# Patient Record
Sex: Male | Born: 1975 | Hispanic: No | Marital: Single | Smoking: Never smoker
Health system: Southern US, Community
[De-identification: ages and names within clinical notes are randomized; demographics above are authoritative.]

---

## 2000-09-19 ENCOUNTER — Emergency Department (HOSPITAL_COMMUNITY): Admission: EM | Admit: 2000-09-19 | Discharge: 2000-09-19 | Payer: Self-pay

## 2016-02-18 ENCOUNTER — Encounter: Payer: Self-pay | Admitting: Physician Assistant

## 2016-02-18 ENCOUNTER — Ambulatory Visit (INDEPENDENT_AMBULATORY_CARE_PROVIDER_SITE_OTHER): Payer: Self-pay | Admitting: Physician Assistant

## 2016-02-18 VITALS — BP 120/78 | HR 64 | Temp 97.9°F | Resp 16 | Ht <= 58 in | Wt 198.0 lb

## 2016-02-18 DIAGNOSIS — Z0000 Encounter for general adult medical examination without abnormal findings: Secondary | ICD-10-CM

## 2016-02-18 DIAGNOSIS — Z23 Encounter for immunization: Secondary | ICD-10-CM

## 2016-02-18 NOTE — Progress Notes (Signed)
Patient ID: Jon Ware MRN: [id number], DOB: 11-22-1975 40 y.o. Date of Encounter: 02/18/2016, 4:02 PM    Chief Complaint: Physical (CPE)  HPI: 40 y.o. y/o male here for CPE.   He reports that he "has never had a physical", "never goes to the doctor".  Decided that he should come get a checkup.  Has no known medical history. Has had no surgeries.  His parents still live in [place] and are living. Father has "sugar ". Father has no other known medical history. Mother has no known medical problems. No other significant/known family medical history.  He came [place] years ago at age 40. Says that he has lived in [place] area most all of that time. For short amount of time he lived in [place]. He works Copypainting interiors. Currently doing work at [employer].   Review of Systems: Consitutional: No fever, chills, fatigue, night sweats, lymphadenopathy, or weight changes. Eyes: No visual changes, eye redness, or discharge. ENT/Mouth: Ears: No otalgia, tinnitus, hearing loss, discharge. Nose: No congestion, rhinorrhea, sinus pain, or epistaxis. Throat: No sore throat, post nasal drip, or teeth pain. Cardiovascular: No CP, palpitations, diaphoresis, DOE, edema, orthopnea, PND. Respiratory: No cough, hemoptysis, SOB, or wheezing. Gastrointestinal: No anorexia, dysphagia, reflux, pain, nausea, vomiting, hematemesis, diarrhea, constipation, BRBPR, or melena. Genitourinary: No dysuria, frequency, urgency, hematuria, incontinence, nocturia, decreased urinary stream, discharge, impotence, or testicular pain/masses. Musculoskeletal: No decreased ROM, myalgias, stiffness, joint swelling, or weakness. Skin: No rash, erythema, lesion changes, pain, warmth, jaundice, or pruritis. Neurological: No headache, dizziness, syncope, seizures, tremors, memory loss, coordination problems, or paresthesias. Psychological: No anxiety, depression, hallucinations,  SI/HI. Endocrine: No fatigue, polydipsia, polyphagia, polyuria, or known diabetes. All other systems were reviewed and are otherwise negative.  History reviewed. No pertinent past medical history.   History reviewed. No pertinent surgical history.  Home Meds:  No outpatient prescriptions prior to visit.   No facility-administered medications prior to visit.     Allergies: Not on File  Social History   Social History  . Marital status: Single    Spouse name: N/A  . Number of children: N/A  . Years of education: N/A   Occupational History  . Not on file.   Social History Main Topics  . Smoking status: Never Smoker  . Smokeless tobacco: Never Used  . Alcohol use No  . Drug use: No  . Sexual activity: No   Other Topics Concern  . Not on file   Social History Narrative  . No narrative on file    Family History  Problem Relation Age of Onset  . Diabetes Father     Physical Exam: Blood pressure 120/78, pulse 64, temperature 97.9 F (36.6 C), temperature source Oral, resp. rate 16, height (!) 5.5" (0.14 m), weight 198 lb (89.8 kg).  General: Well developed, well nourished,Appears in no acute distress. HEENT: Normocephalic, atraumatic. Conjunctiva pink, sclera non-icteric. Pupils 2 mm constricting to 1 mm, round, regular, and equally reactive to light and accomodation. EOMI. Internal auditory canal clear. TMs with good cone of light and without pathology. Nasal mucosa pink. Nares are without discharge. No sinus tenderness. Oral mucosa pink. Dentition good. Pharynx without exudate.   Neck: Supple. Trachea midline. No thyromegaly. Full ROM. No lymphadenopathy. Lungs: Clear to auscultation bilaterally without wheezes, rales, or rhonchi. Breathing is of normal effort and unlabored. Cardiovascular: RRR with S1 S2. No murmurs, rubs, or gallops. Distal pulses 2+ symmetrically. No carotid or abdominal bruits. Abdomen:  Soft, non-tender, non-distended with normoactive bowel sounds.  No hepatosplenomegaly or masses. No rebound/guarding. No CVA tenderness. No hernias. Musculoskeletal: Full range of motion and 5/5 strength throughout. Skin: Warm and moist without erythema, ecchymosis, wounds, or rash. Neuro: A+Ox3. CN II-XII grossly intact. Moves all extremities spontaneously. Full sensation throughout. Normal gait. DTR 2+ throughout upper and lower extremities.  Psych:  Responds to questions appropriately with a normal affect.   Assessment/Plan:  40 y.o. y/o  male here for CPE  -1. Encounter for preventive health examination A. Screening Labs: He is not fasting but can return fasting for lab work in one week. - CBC with Differential/Platelet; Future - COMPLETE METABOLIC PANEL WITH GFR; Future - Lipid panel; Future - TSH; Future   B. Screening For Prostate Cancer: N/A until age 73  C. Screening For Colorectal Cancer:  N/A until age 1  D. Immunizations: Flu--------------N/A Tetanus----he has had no tetanus vaccine in the past 10 years. Recommend T dap today. He is agreeable. Tdap given here 02/18/16 Pneumococcal----he has no indication to require a pneumonia vaccine until age 30 Zostavax--------- not indicated until age 34   2. Immunization due - Tdap vaccine greater than or equal to 7yo IM    Signed:   [address] [place], [place]  02/18/2016 4:02 PM

## 2016-02-20 ENCOUNTER — Other Ambulatory Visit: Payer: Self-pay

## 2016-10-14 ENCOUNTER — Encounter: Payer: Self-pay | Admitting: Physician Assistant

## 2016-12-29 ENCOUNTER — Encounter: Payer: Self-pay | Admitting: Physician Assistant

## 2017-02-16 ENCOUNTER — Encounter: Payer: Self-pay | Admitting: Physician Assistant

## 2018-11-06 ENCOUNTER — Ambulatory Visit
Admission: RE | Admit: 2018-11-06 | Discharge: 2018-11-06 | Disposition: A | Discharge status: Home / Self Care | Payer: No Typology Code available for payment source | Source: Ambulatory Visit | Attending: Nurse Practitioner | Admitting: Nurse Practitioner

## 2018-11-06 ENCOUNTER — Other Ambulatory Visit: Payer: Self-pay | Admitting: Nurse Practitioner

## 2018-11-06 DIAGNOSIS — M25511 Pain in right shoulder: Secondary | ICD-10-CM

## 2020-03-01 IMAGING — CR RIGHT SHOULDER - 2+ VIEW
3 series · 3 of 3 positions shown · non-contrast
Comparison: None.

CLINICAL DATA: Shoulder pain following heavy lifting, initial
encounter

EXAM:
RIGHT SHOULDER - 2+ VIEW

[w shoulder ap internal righ]
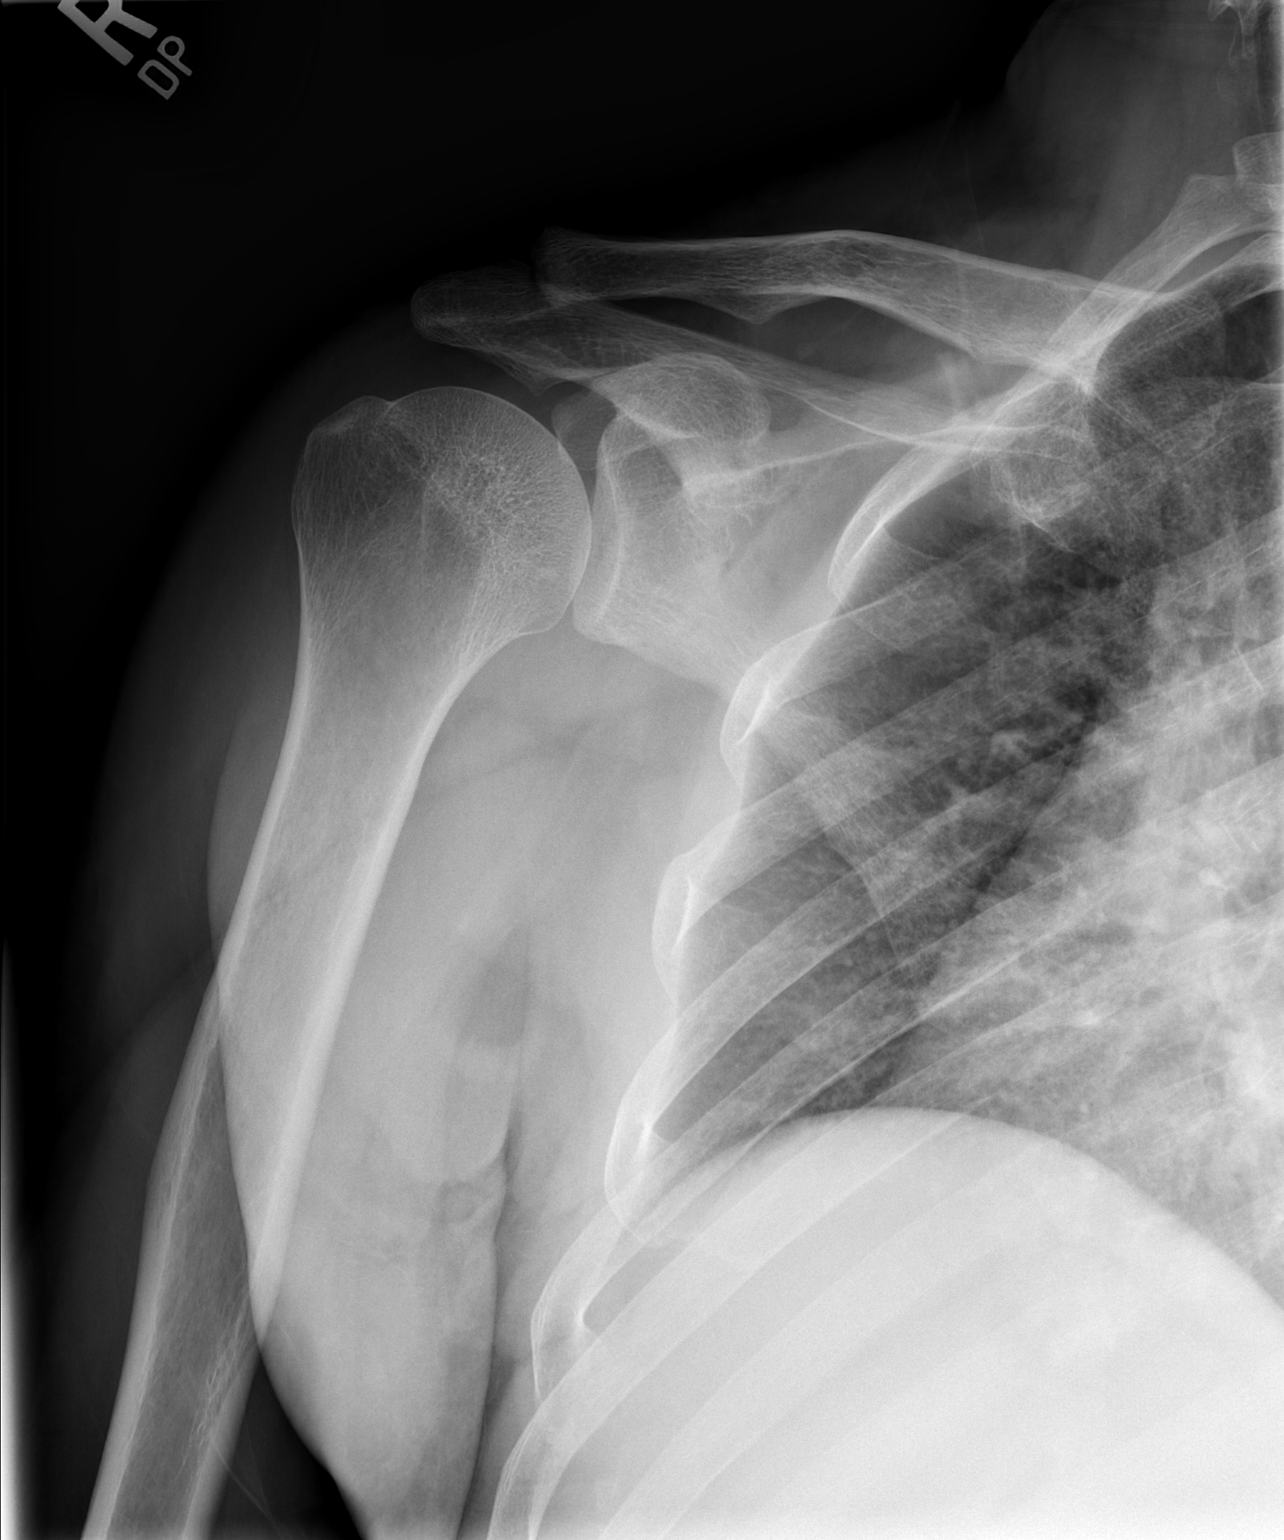

[w shoulder y view right]
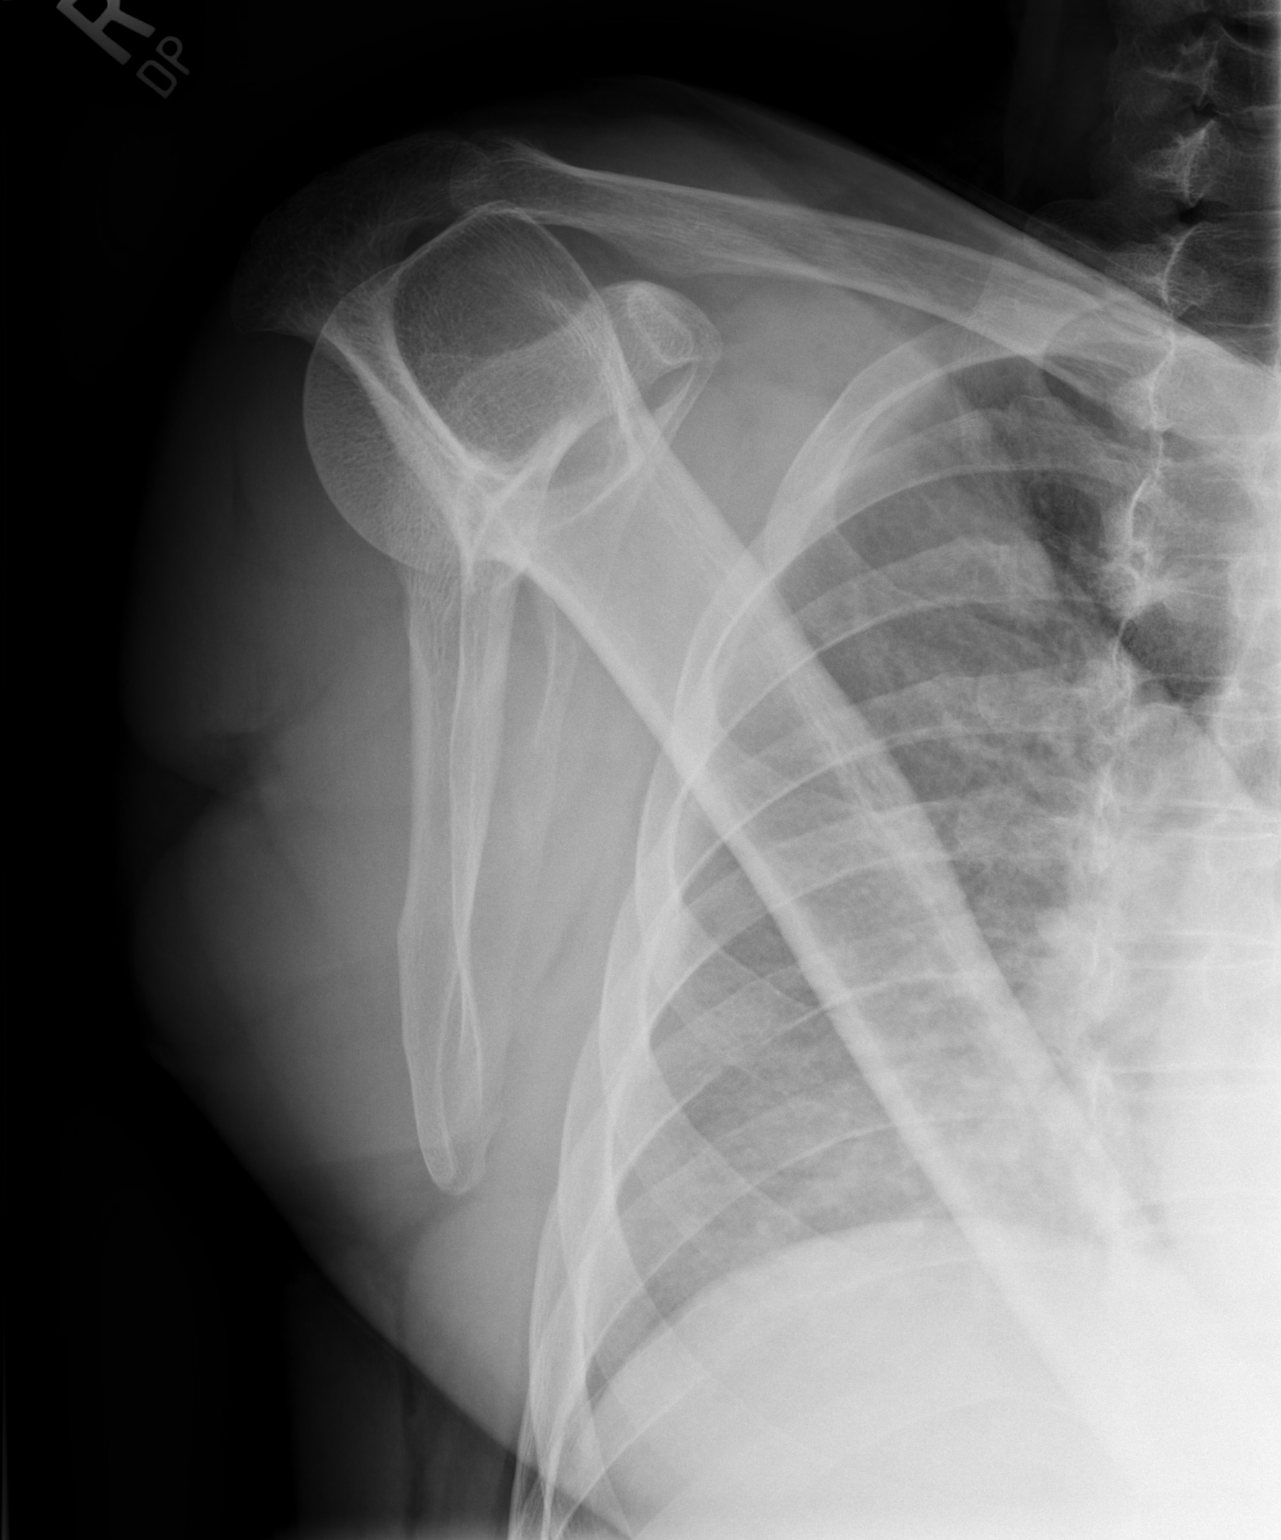

[w shoulder axillary right *]
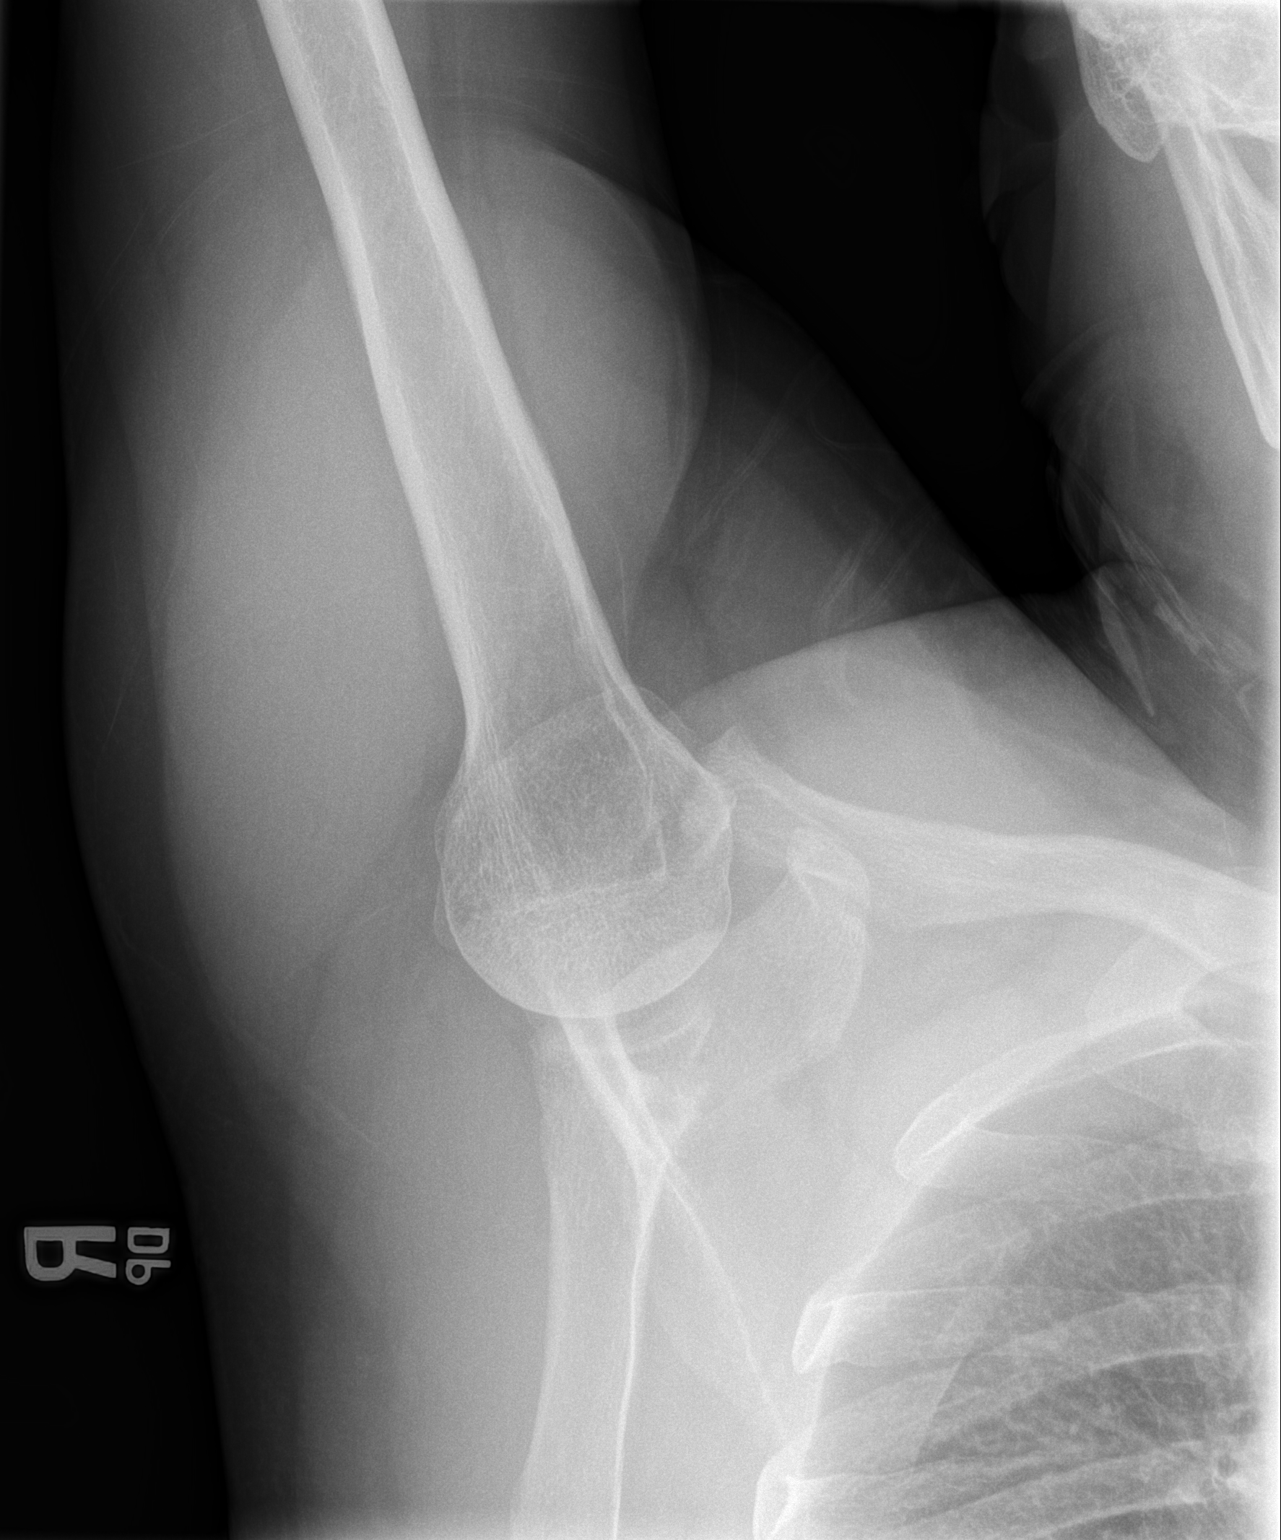

[3 of 3 positions shown; findings below may reference images not displayed]

FINDINGS: There is no evidence of fracture or dislocation. There is no
evidence of arthropathy or other focal bone abnormality. Soft
tissues are unremarkable.
IMPRESSION: No acute abnormality noted.
